# Patient Record
Sex: Male | Born: 1960 | Race: White | Hispanic: No | Marital: Single | Smoking: Never smoker
Health system: Southern US, Community
[De-identification: ages and names within clinical notes are randomized; demographics above are authoritative.]

## PROBLEM LIST (undated history)

## (undated) DIAGNOSIS — B182 Chronic viral hepatitis C: Secondary | ICD-10-CM

## (undated) DIAGNOSIS — I10 Essential (primary) hypertension: Secondary | ICD-10-CM

## (undated) DIAGNOSIS — K7460 Unspecified cirrhosis of liver: Secondary | ICD-10-CM

## (undated) HISTORY — PX: ESOPHAGUS SURGERY: SHX626

---

## 2015-10-18 ENCOUNTER — Emergency Department (HOSPITAL_COMMUNITY)
Admission: EM | Admit: 2015-10-18 | Discharge: 2015-10-18 | Disposition: A | Discharge status: Home / Self Care | Payer: Medicaid Other | Attending: Emergency Medicine | Admitting: Emergency Medicine

## 2015-10-18 ENCOUNTER — Emergency Department (HOSPITAL_COMMUNITY): Payer: Medicaid Other

## 2015-10-18 ENCOUNTER — Encounter (HOSPITAL_COMMUNITY): Payer: Self-pay | Admitting: Emergency Medicine

## 2015-10-18 DIAGNOSIS — S3991XA Unspecified injury of abdomen, initial encounter: Secondary | ICD-10-CM | POA: Insufficient documentation

## 2015-10-18 DIAGNOSIS — S8992XA Unspecified injury of left lower leg, initial encounter: Secondary | ICD-10-CM | POA: Insufficient documentation

## 2015-10-18 DIAGNOSIS — Z8619 Personal history of other infectious and parasitic diseases: Secondary | ICD-10-CM | POA: Diagnosis not present

## 2015-10-18 DIAGNOSIS — S4991XA Unspecified injury of right shoulder and upper arm, initial encounter: Secondary | ICD-10-CM | POA: Insufficient documentation

## 2015-10-18 DIAGNOSIS — S299XXA Unspecified injury of thorax, initial encounter: Secondary | ICD-10-CM | POA: Diagnosis not present

## 2015-10-18 DIAGNOSIS — Y92410 Unspecified street and highway as the place of occurrence of the external cause: Secondary | ICD-10-CM | POA: Diagnosis not present

## 2015-10-18 DIAGNOSIS — R52 Pain, unspecified: Secondary | ICD-10-CM

## 2015-10-18 DIAGNOSIS — I10 Essential (primary) hypertension: Secondary | ICD-10-CM | POA: Diagnosis not present

## 2015-10-18 DIAGNOSIS — Z880 Allergy status to penicillin: Secondary | ICD-10-CM | POA: Insufficient documentation

## 2015-10-18 DIAGNOSIS — Y998 Other external cause status: Secondary | ICD-10-CM | POA: Insufficient documentation

## 2015-10-18 DIAGNOSIS — S199XXA Unspecified injury of neck, initial encounter: Secondary | ICD-10-CM | POA: Insufficient documentation

## 2015-10-18 DIAGNOSIS — S3992XA Unspecified injury of lower back, initial encounter: Secondary | ICD-10-CM | POA: Insufficient documentation

## 2015-10-18 DIAGNOSIS — S8991XA Unspecified injury of right lower leg, initial encounter: Secondary | ICD-10-CM | POA: Diagnosis not present

## 2015-10-18 DIAGNOSIS — Y9389 Activity, other specified: Secondary | ICD-10-CM | POA: Diagnosis not present

## 2015-10-18 DIAGNOSIS — Z8719 Personal history of other diseases of the digestive system: Secondary | ICD-10-CM | POA: Diagnosis not present

## 2015-10-18 HISTORY — DX: Essential (primary) hypertension: I10

## 2015-10-18 HISTORY — DX: Chronic viral hepatitis C: B18.2

## 2015-10-18 HISTORY — DX: Unspecified cirrhosis of liver: K74.60

## 2015-10-18 MED ORDER — IBUPROFEN 800 MG PO TABS
800.0000 mg | ORAL_TABLET | Freq: Once | ORAL | Status: AC
  Administered 2015-10-18: 800 mg via ORAL
  Filled 2015-10-18: qty 1

## 2015-10-18 MED ORDER — IBUPROFEN 800 MG PO TABS
800.0000 mg | ORAL_TABLET | Freq: Three times a day (TID) | ORAL | Status: DC
Start: 2015-10-18 — End: 2017-06-19
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
## 2015-10-18 NOTE — Discharge Instructions (Signed)

## 2017-01-08 ENCOUNTER — Encounter (HOSPITAL_COMMUNITY): Payer: Self-pay | Admitting: Emergency Medicine

## 2017-01-08 ENCOUNTER — Emergency Department (HOSPITAL_COMMUNITY)
Admission: EM | Admit: 2017-01-08 | Discharge: 2017-01-09 | Disposition: A | Discharge status: Home / Self Care | Payer: Medicaid Other | Attending: Emergency Medicine | Admitting: Emergency Medicine

## 2017-01-08 DIAGNOSIS — Z79899 Other long term (current) drug therapy: Secondary | ICD-10-CM | POA: Diagnosis not present

## 2017-01-08 DIAGNOSIS — K7460 Unspecified cirrhosis of liver: Secondary | ICD-10-CM | POA: Insufficient documentation

## 2017-01-08 DIAGNOSIS — I10 Essential (primary) hypertension: Secondary | ICD-10-CM | POA: Insufficient documentation

## 2017-01-08 DIAGNOSIS — R410 Disorientation, unspecified: Secondary | ICD-10-CM | POA: Diagnosis present

## 2017-01-08 LAB — COMPREHENSIVE METABOLIC PANEL
ALBUMIN: 3.5 g/dL (ref 3.5–5.0)
ALK PHOS: 48 U/L (ref 38–126)
ALT: 19 U/L (ref 17–63)
AST: 31 U/L (ref 15–41)
Anion gap: 8 (ref 5–15)
BUN: 11 mg/dL (ref 6–20)
CALCIUM: 9.1 mg/dL (ref 8.9–10.3)
CO2: 23 mmol/L (ref 22–32)
CREATININE: 0.62 mg/dL (ref 0.61–1.24)
Chloride: 107 mmol/L (ref 101–111)
GFR calc Af Amer: >60 mL/min (ref >60)
GFR calc non Af Amer: >60 mL/min (ref >60)
GLUCOSE: 100 mg/dL — AB (ref 65–99)
Potassium: 3.6 mmol/L (ref 3.5–5.1)
SODIUM: 138 mmol/L (ref 135–145)
Total Bilirubin: 1.5 mg/dL — ABNORMAL HIGH (ref 0.3–1.2)
Total Protein: 7.9 g/dL (ref 6.5–8.1)

## 2017-01-08 LAB — CBC
HCT: 35.1 % — ABNORMAL LOW (ref 39.0–52.0)
Hemoglobin: 11.8 g/dL — ABNORMAL LOW (ref 13.0–17.0)
MCH: 29.4 pg (ref 26.0–34.0)
MCHC: 33.6 g/dL (ref 30.0–36.0)
MCV: 87.5 fL (ref 78.0–100.0)
PLATELETS: DECREASED 10*3/uL (ref 150–400)
RBC: 4.01 MIL/uL — ABNORMAL LOW (ref 4.22–5.81)
RDW: 14.9 % (ref 11.5–15.5)
WBC: 2.9 10*3/uL — ABNORMAL LOW (ref 4.0–10.5)

## 2017-01-08 LAB — CBG MONITORING, ED: GLUCOSE-CAPILLARY: 105 mg/dL — AB (ref 65–99)

## 2017-01-08 LAB — ETHANOL: Alcohol, Ethyl (B): <5 mg/dL (ref <5)

## 2017-01-08 LAB — AMMONIA: Ammonia: 80 umol/L — ABNORMAL HIGH (ref 9–35)

## 2017-01-08 LAB — SALICYLATE LEVEL

## 2017-01-08 LAB — ACETAMINOPHEN LEVEL: Acetaminophen (Tylenol), Serum: <10 ug/mL — ABNORMAL LOW (ref 10–30)

## 2017-01-08 NOTE — ED Triage Notes (Addendum)
Patient states "I was in the hospital last week for my ammonia level being up and I think it's up again. Since this morning, I've felt confused and disoriented. I've been dizzy and falling down too." Denies head injury or LOC. States "I'm staggering like I'm drunk but I haven't drank anything."

## 2017-01-08 NOTE — ED Provider Notes (Signed)
[facility]-EMERGENCY DEPT Provider Note   CSN: [id number] Arrival date & time: 01/08/17  2150  By signing my name below, I, Andre Sanchez, attest that this documentation has been prepared under the direction and in the presence of Nira Conn, MD. Electronically Signed: Rosario Sanchez, ED Scribe. 01/08/17. 10:29 PM.  History   Chief Complaint Chief Complaint  Patient presents with  . Altered Mental Status   The history is provided by the patient. No language interpreter was used.    HPI Comments: Andre Sanchez is a 56 y.o. male with a h/o chronic hepatitis C, HTN, and liver cirrhosis, who presents to the Emergency Department complaining of gradually worsening confusion and disorientation beginning this morning. Last known normal: 12 hours ago. Pt also reports that he has had increased issues with balance and coordination issues, described as him "feeling like I'm drunk but I haven't been drinking". Pt reports that ten days ago that he was admitted for one week in the hospital for an increased ammonia level. He was d/c'd three days ago and has been staying with a friend. Per friend at bedside, pt was normal when he woke up this morning but as he began working this morning around 10am, he noticed that the pt was increasing confused and acting away from his baseline. No recent falls, head injury or episode of syncope. He denies fever, cough, nausea, vomiting, diarrhea, or any other associated symptoms.   Past Medical History:  Diagnosis Date  . Hepatitis C, chronic (HCC)   . Hypertension   . Liver cirrhosis (HCC)    There are no active problems to display for this patient.  Past Surgical History:  Procedure Laterality Date  . ESOPHAGUS SURGERY      Home Medications    Prior to Admission medications   Medication Sig Start Date End Date Taking? Authorizing Provider  CONSTULOSE 10 GM/15ML solution TAKE 67.5ML THREE TIMES DAILY 01/03/17   Historical Provider, MD    furosemide (LASIX) 40 MG tablet Take 40 mg by mouth 2 (two) times daily. 01/03/17   Historical Provider, MD  ibuprofen (ADVIL,MOTRIN) 800 MG tablet Take 1 tablet (800 mg total) by mouth 3 (three) times daily. 10/18/15   Cheri Fowler, PA-C  oxyCODONE (OXY IR/ROXICODONE) 5 MG immediate release tablet Take 5 mg by mouth 4 (four) times daily as needed. for pain 01/03/17   Historical Provider, MD  pantoprazole (PROTONIX) 40 MG tablet Take 40 mg by mouth daily. 01/03/17   Historical Provider, MD  propranolol (INDERAL) 10 MG tablet TAKE 1 TABLET BY MOUTH EVERY TWELVE HOURS 11/01/16   Historical Provider, MD  SUBOXONE 8-2 MG FILM DISSOLVE FILMS UNDER THE TONGUE TWICE DAILY 12/29/16   Historical Provider, MD   Family History Family History  Problem Relation Age of Onset  . Hypertension Mother    Social History Social History  Substance Use Topics  . Smoking status: Never Smoker  . Smokeless tobacco: Never Used  . Alcohol use No   Allergies   Penicillins  Review of Systems Review of Systems  A complete 10 system review of systems was obtained and all systems are negative except as noted in the HPI and PMH.   Physical Exam Updated Vital Signs BP 161/81 (BP Location: Left Arm)   Pulse 105   Temp 97.4 F (36.3 C) (Oral)   Resp 22   Ht 6\' 7"  (2.007 m)   Wt 300 lb (136.1 kg)   SpO2 100%   BMI 33.80 kg/m  Physical Exam  Constitutional: He is oriented to person, place, and time. He appears well-developed and well-nourished. No distress.  HENT:  Head: Normocephalic and atraumatic.  Nose: Nose normal.  Eyes: Conjunctivae and EOM are normal. Pupils are equal, round, and reactive to light. Right eye exhibits no discharge. Left eye exhibits no discharge. No scleral icterus.  Neck: Normal range of motion. Neck supple.  Cardiovascular: Normal rate and regular rhythm.  Exam reveals no gallop and no friction rub.   No murmur heard. Pulmonary/Chest: Effort normal and breath sounds normal. No stridor. No  respiratory distress. He has no rales.  Abdominal: Soft. He exhibits no distension. There is no tenderness.  Musculoskeletal: He exhibits no edema or tenderness.  Neurological: He is alert and oriented to person, place, and time. He has normal strength. No cranial nerve deficit.  Pt able to ambulate without complication in the ED  Skin: Skin is warm and dry. No rash noted. He is not diaphoretic. No erythema.  Psychiatric: He has a normal mood and affect.  Vitals reviewed.  ED Treatments / Results  DIAGNOSTIC STUDIES: Oxygen Saturation is 100% on RA, normal by my interpretation.   COORDINATION OF CARE: 10:26 PM-Discussed next steps with pt. Pt verbalized understanding and is agreeable with the plan.   Labs (all labs ordered are listed, but only abnormal results are displayed) Labs Reviewed  COMPREHENSIVE METABOLIC PANEL - Abnormal; Notable for the following:       Result Value   Glucose, Bld 100 (*)    Total Bilirubin 1.5 (*)    All other components within normal limits  CBC - Abnormal; Notable for the following:    WBC 2.9 (*)    RBC 4.01 (*)    Hemoglobin 11.8 (*)    HCT 35.1 (*)    All other components within normal limits  AMMONIA - Abnormal; Notable for the following:    Ammonia 80 (*)    All other components within normal limits  RAPID URINE DRUG SCREEN, HOSP PERFORMED - Abnormal; Notable for the following:    Cocaine POSITIVE (*)    All other components within normal limits  ACETAMINOPHEN LEVEL - Abnormal; Notable for the following:    Acetaminophen (Tylenol), Serum <10 (*)    All other components within normal limits  CBG MONITORING, ED - Abnormal; Notable for the following:    Glucose-Capillary 105 (*)    All other components within normal limits  ETHANOL  SALICYLATE LEVEL   EKG  EKG Interpretation None      Radiology No results found.  Procedures Procedures  Medications Ordered in ED Medications - No data to display  Initial Impression /  Assessment and Plan / ED Course  I have reviewed the triage vital signs and the nursing notes.  Pertinent labs & imaging results that were available during my care of the patient were reviewed by me and considered in my medical decision making (see chart for details).     Elevated ammonia but pt is oriented x4. Able to ambulate without complication in the ED. Other labs reassuring. The patient is safe for discharge with strict return precautions.   Final Clinical Impressions(s) / ED Diagnoses   Final diagnoses:  Cirrhosis of liver without ascites, unspecified hepatic cirrhosis type (HCC)   Disposition: Discharge  Condition: Good  I have discussed the results, Dx and Tx plan with the patient who expressed understanding and agree(s) with the plan. Discharge instructions discussed at great length. The patient was given strict  return precautions who verbalized understanding of the instructions. No further questions at time of discharge.    New Prescriptions   No medications on file    Follow Up: primary care provider  Schedule an appointment as soon as possible for a visit  As needed   I personally performed the services described in this documentation, which was scribed in my presence. The recorded information has been reviewed and is accurate.        Nira Conn, MD 01/09/17 [phone]

## 2017-01-09 LAB — RAPID URINE DRUG SCREEN, HOSP PERFORMED
AMPHETAMINES: NOT DETECTED
BARBITURATES: NOT DETECTED
Benzodiazepines: NOT DETECTED
Cocaine: POSITIVE — AB
Opiates: NOT DETECTED
TETRAHYDROCANNABINOL: NOT DETECTED

## 2017-01-09 NOTE — ED Notes (Signed)
Pt stable on his feet pt walked about a total of 20 feet without problems. Dr went in to talk to the patient about him he does not need to be admitted in his option pt became mad about this and he got dressed and called his ride

## 2017-01-09 NOTE — ED Notes (Signed)
Attempted to ambulate patient but he refused to ambulate.

## 2017-06-19 ENCOUNTER — Encounter (HOSPITAL_COMMUNITY): Payer: Self-pay | Admitting: Emergency Medicine

## 2017-06-19 ENCOUNTER — Emergency Department (HOSPITAL_COMMUNITY)
Admission: EM | Admit: 2017-06-19 | Discharge: 2017-06-19 | Disposition: A | Discharge status: Home / Self Care | Payer: Medicaid Other | Attending: Emergency Medicine | Admitting: Emergency Medicine

## 2017-06-19 ENCOUNTER — Emergency Department (HOSPITAL_COMMUNITY): Payer: Medicaid Other

## 2017-06-19 DIAGNOSIS — L03115 Cellulitis of right lower limb: Secondary | ICD-10-CM

## 2017-06-19 DIAGNOSIS — Z79899 Other long term (current) drug therapy: Secondary | ICD-10-CM | POA: Insufficient documentation

## 2017-06-19 DIAGNOSIS — R2241 Localized swelling, mass and lump, right lower limb: Secondary | ICD-10-CM | POA: Diagnosis present

## 2017-06-19 DIAGNOSIS — I10 Essential (primary) hypertension: Secondary | ICD-10-CM | POA: Insufficient documentation

## 2017-06-19 LAB — COMPREHENSIVE METABOLIC PANEL
ALT: 12 U/L — ABNORMAL LOW (ref 17–63)
AST: 27 U/L (ref 15–41)
Albumin: 3.2 g/dL — ABNORMAL LOW (ref 3.5–5.0)
Alkaline Phosphatase: 42 U/L (ref 38–126)
Anion gap: 6 (ref 5–15)
BUN: 9 mg/dL (ref 6–20)
CHLORIDE: 108 mmol/L (ref 101–111)
CO2: 21 mmol/L — ABNORMAL LOW (ref 22–32)
CREATININE: 0.62 mg/dL (ref 0.61–1.24)
Calcium: 8.7 mg/dL — ABNORMAL LOW (ref 8.9–10.3)
Glucose, Bld: 111 mg/dL — ABNORMAL HIGH (ref 65–99)
POTASSIUM: 3.7 mmol/L (ref 3.5–5.1)
Sodium: 135 mmol/L (ref 135–145)
Total Bilirubin: 1.9 mg/dL — ABNORMAL HIGH (ref 0.3–1.2)
Total Protein: 7.7 g/dL (ref 6.5–8.1)

## 2017-06-19 LAB — CBC WITH DIFFERENTIAL/PLATELET
BASOS ABS: 0 10*3/uL (ref 0.0–0.1)
BASOS PCT: 0 %
EOS ABS: 0.2 10*3/uL (ref 0.0–0.7)
Eosinophils Relative: 7 %
HEMATOCRIT: 32 % — AB (ref 39.0–52.0)
HEMOGLOBIN: 11 g/dL — AB (ref 13.0–17.0)
Lymphocytes Relative: 21 %
Lymphs Abs: 0.6 10*3/uL — ABNORMAL LOW (ref 0.7–4.0)
MCH: 30.4 pg (ref 26.0–34.0)
MCHC: 34.4 g/dL (ref 30.0–36.0)
MCV: 88.4 fL (ref 78.0–100.0)
MONOS PCT: 13 %
Monocytes Absolute: 0.4 10*3/uL (ref 0.1–1.0)
NEUTROS ABS: 1.8 10*3/uL (ref 1.7–7.7)
NEUTROS PCT: 59 %
Platelets: 100 10*3/uL — ABNORMAL LOW (ref 150–400)
RBC: 3.62 MIL/uL — ABNORMAL LOW (ref 4.22–5.81)
RDW: 15.2 % (ref 11.5–15.5)
WBC: 3 10*3/uL — AB (ref 4.0–10.5)

## 2017-06-19 MED ORDER — SULFAMETHOXAZOLE-TRIMETHOPRIM 800-160 MG PO TABS: 1.0000 | ORAL_TABLET | Freq: Two times a day (BID) | ORAL | 0 refills | Status: AC

## 2017-06-19 MED ORDER — VANCOMYCIN HCL IN DEXTROSE 1-5 GM/200ML-% IV SOLN
1000.0000 mg | Freq: Once | INTRAVENOUS | Status: AC
  Administered 2017-06-19: 1000 mg via INTRAVENOUS
  Filled 2017-06-19: qty 200

## 2017-06-19 NOTE — ED Triage Notes (Signed)
Pt reports right leg swelling from the knee down with redness x 2 days.

## 2017-06-19 NOTE — ED Provider Notes (Signed)
[facility]-EMERGENCY DEPT Provider Note   CSN: [id number] Arrival date & time: 06/19/17  1316     History   Chief Complaint Chief Complaint  Patient presents with  . Leg Swelling    HPI Andre Sanchez is a 56 y.o. male.  Patient complains of pain and swelling to his right lower leg.    Leg Pain   This is a new problem. The current episode started more than 2 days ago. The problem occurs constantly. The problem has not changed since onset.Pain location: right lower leg. The quality of the pain is described as aching. The pain is at a severity of 5/10. The pain is moderate. Pertinent negatives include no numbness. He has tried nothing for the symptoms. The treatment provided no relief.    Past Medical History:  Diagnosis Date  . Hepatitis C, chronic (HCC)   . Hypertension   . Liver cirrhosis (HCC)     There are no active problems to display for this patient.   Past Surgical History:  Procedure Laterality Date  . ESOPHAGUS SURGERY         Home Medications    Prior to Admission medications   Medication Sig Start Date End Date Taking? Authorizing Provider  spironolactone (ALDACTONE) 50 MG tablet Take 50 mg by mouth daily.    Yes [provider]  CONSTULOSE 10 GM/15ML solution TAKE 67.5ML THREE TIMES DAILY 01/03/17   [provider]  furosemide (LASIX) 40 MG tablet Take 40 mg by mouth 2 (two) times daily. 01/03/17   [provider]  oxyCODONE (OXY IR/ROXICODONE) 5 MG immediate release tablet Take 5 mg by mouth 4 (four) times daily as needed. for pain 01/03/17   [provider]  pantoprazole (PROTONIX) 40 MG tablet Take 40 mg by mouth daily. 01/03/17   [provider]  propranolol (INDERAL) 10 MG tablet TAKE 1 TABLET BY MOUTH EVERY TWELVE HOURS 11/01/16   [provider]  SUBOXONE 8-2 MG FILM DISSOLVE FILMS UNDER THE TONGUE TWICE DAILY 12/29/16   [provider]  sulfamethoxazole-trimethoprim (BACTRIM DS,SEPTRA DS) 800-160 MG  tablet Take 1 tablet by mouth 2 (two) times daily. 06/19/17 06/26/17  Bethann BerkshireZammit, Braylyn Eye, MD    Family History Family History  Problem Relation Age of Onset  . Hypertension Mother     Social History Social History  Substance Use Topics  . Smoking status: Never Smoker  . Smokeless tobacco: Never Used  . Alcohol use No     Allergies   Penicillins   Review of Systems Review of Systems  Constitutional: Negative for appetite change and fatigue.  HENT: Negative for congestion, ear discharge and sinus pressure.   Eyes: Negative for discharge.  Respiratory: Negative for cough.   Cardiovascular: Negative for chest pain.  Gastrointestinal: Negative for abdominal pain and diarrhea.  Genitourinary: Negative for frequency and hematuria.  Musculoskeletal: Negative for back pain.       Right lower leg mildly swollen red tender  Skin: Positive for rash.  Neurological: Negative for seizures, numbness and headaches.  Psychiatric/Behavioral: Negative for hallucinations.     Physical Exam Updated Vital Signs BP 120/78   Pulse 100   Temp 97.9 F (36.6 C) (Oral)   Resp 18   Ht 6\' 4"  (1.93 m)   Wt (!) 145.2 kg (320 lb)   SpO2 98%   BMI 38.95 kg/m   Physical Exam  Constitutional: He is oriented to person, place, and time. He appears well-developed.  HENT:  Head: Normocephalic.  Eyes:  Conjunctivae and EOM are normal. No scleral icterus.  Neck: Neck supple. No thyromegaly present.  Cardiovascular: Normal rate and regular rhythm.  Exam reveals no gallop and no friction rub.   No murmur heard. Pulmonary/Chest: No stridor. He has no wheezes. He has no rales. He exhibits no tenderness.  Abdominal: He exhibits no distension. There is no tenderness. There is no rebound.  Musculoskeletal: Normal range of motion. He exhibits no edema.  Swelling and tenderness to right lowe leg.  Also redness  Lymphadenopathy:    He has no cervical adenopathy.  Neurological: He is oriented to person, place,  and time. He exhibits normal muscle tone. Coordination normal.  Skin: No rash noted. No erythema.  Psychiatric: He has a normal mood and affect. His behavior is normal.     ED Treatments / Results  Labs (all labs ordered are listed, but only abnormal results are displayed) Labs Reviewed  CBC WITH DIFFERENTIAL/PLATELET - Abnormal; Notable for the following:       Result Value   WBC 3.0 (*)    RBC 3.62 (*)    Hemoglobin 11.0 (*)    HCT 32.0 (*)    Platelets 100 (*)    Lymphs Abs 0.6 (*)    All other components within normal limits  COMPREHENSIVE METABOLIC PANEL - Abnormal; Notable for the following:    CO2 21 (*)    Glucose, Bld 111 (*)    Calcium 8.7 (*)    Albumin 3.2 (*)    ALT 12 (*)    Total Bilirubin 1.9 (*)    All other components within normal limits    EKG  EKG Interpretation None       Radiology Dg Tibia/fibula Right  Result Date: 06/19/2017 CLINICAL DATA:  Acute right lower leg pain, swelling and redness for 2 days. No known injury. Initial encounter. EXAM: RIGHT TIBIA AND FIBULA - 2 VIEW COMPARISON:  None. FINDINGS: There is no evidence of acute fracture, subluxation, dislocation or focal bony abnormality. Soft tissue swelling is noted. There is no evidence of radiopaque foreign body. IMPRESSION: Soft tissue swelling without acute bony abnormality. Electronically Signed   By: Harmon PierJeffrey  Hu M.D.   On: 06/19/2017 14:50    Procedures Procedures (including critical care time)  Medications Ordered in ED Medications  vancomycin (VANCOCIN) IVPB 1000 mg/200 mL premix (1,000 mg Intravenous New Bag/Given 06/19/17 1451)     Initial Impression / Assessment and Plan / ED Course  I have reviewed the triage vital signs and the nursing notes.  Pertinent labs & imaging results that were available during my care of the patient were reviewed by me and considered in my medical decision making (see chart for details). Pt has a cellulitis to the right lower leg.  rx bactrim  and follow up in 2 days      Final Clinical Impressions(s) / ED Diagnoses   Final diagnoses:  Cellulitis of right leg    New Prescriptions New Prescriptions   SULFAMETHOXAZOLE-TRIMETHOPRIM (BACTRIM DS,SEPTRA DS) 800-160 MG TABLET    Take 1 tablet by mouth 2 (two) times daily.     Bethann BerkshireZammit, Angelyse Heslin, MD 06/19/17 [phone]

## 2017-06-19 NOTE — Discharge Instructions (Signed)
Follow-up with Dr. Sudie BaileyKnowlton or return here in 2 days for recheck

## 2018-01-03 IMAGING — DX DG TIBIA/FIBULA 2V*R*
4 series · 4 of 4 positions shown · non-contrast
Comparison: None.

CLINICAL DATA: Acute right lower leg pain, swelling and redness for
2 days. No known injury. Initial encounter.

EXAM:
RIGHT TIBIA AND FIBULA - 2 VIEW

[tibia ap (1 of 2)]
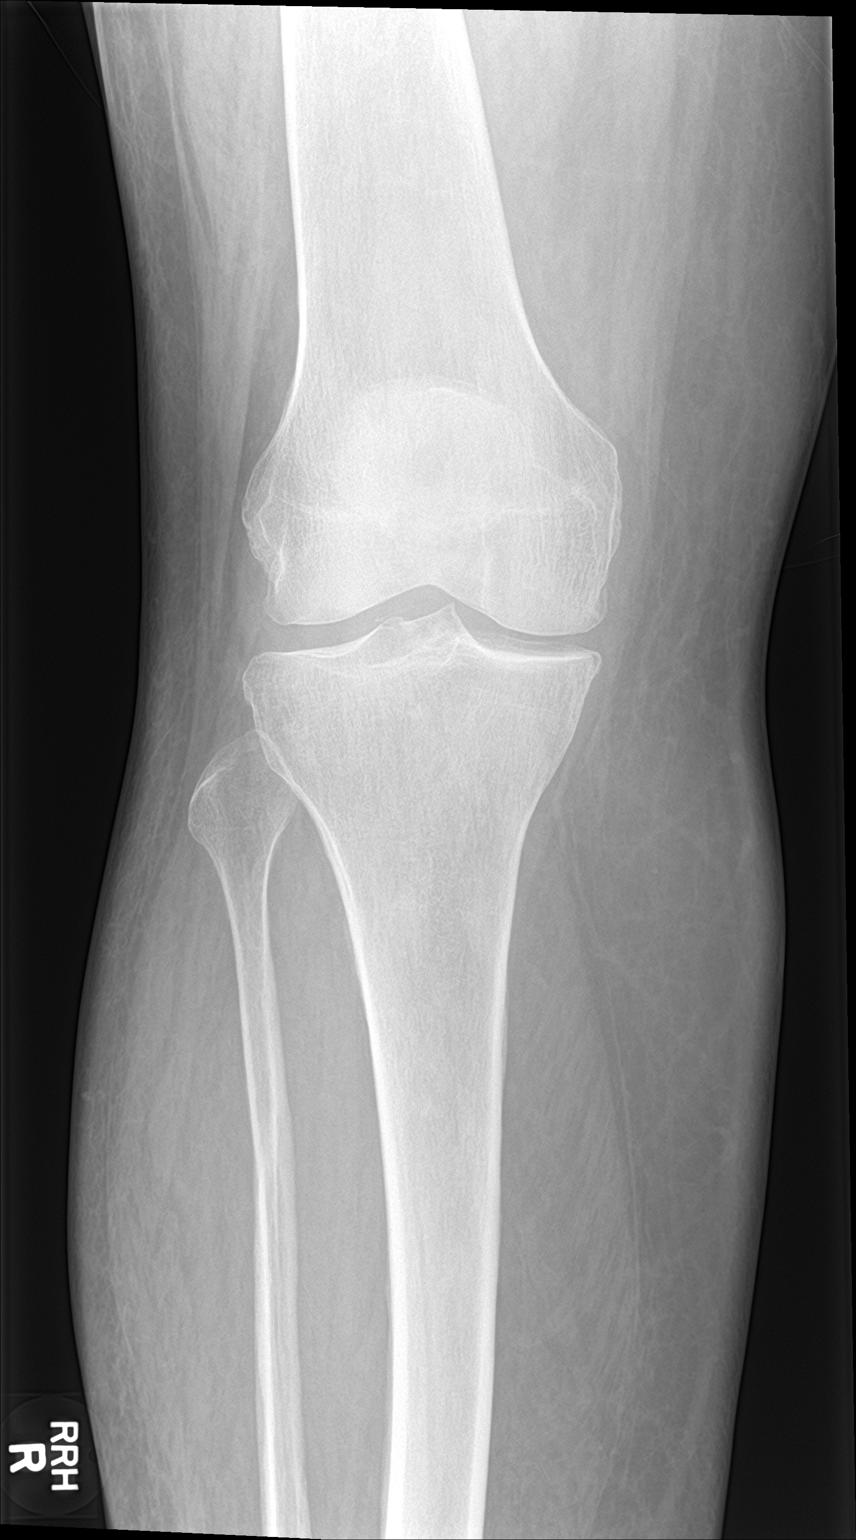

[tibia ap (2 of 2)]
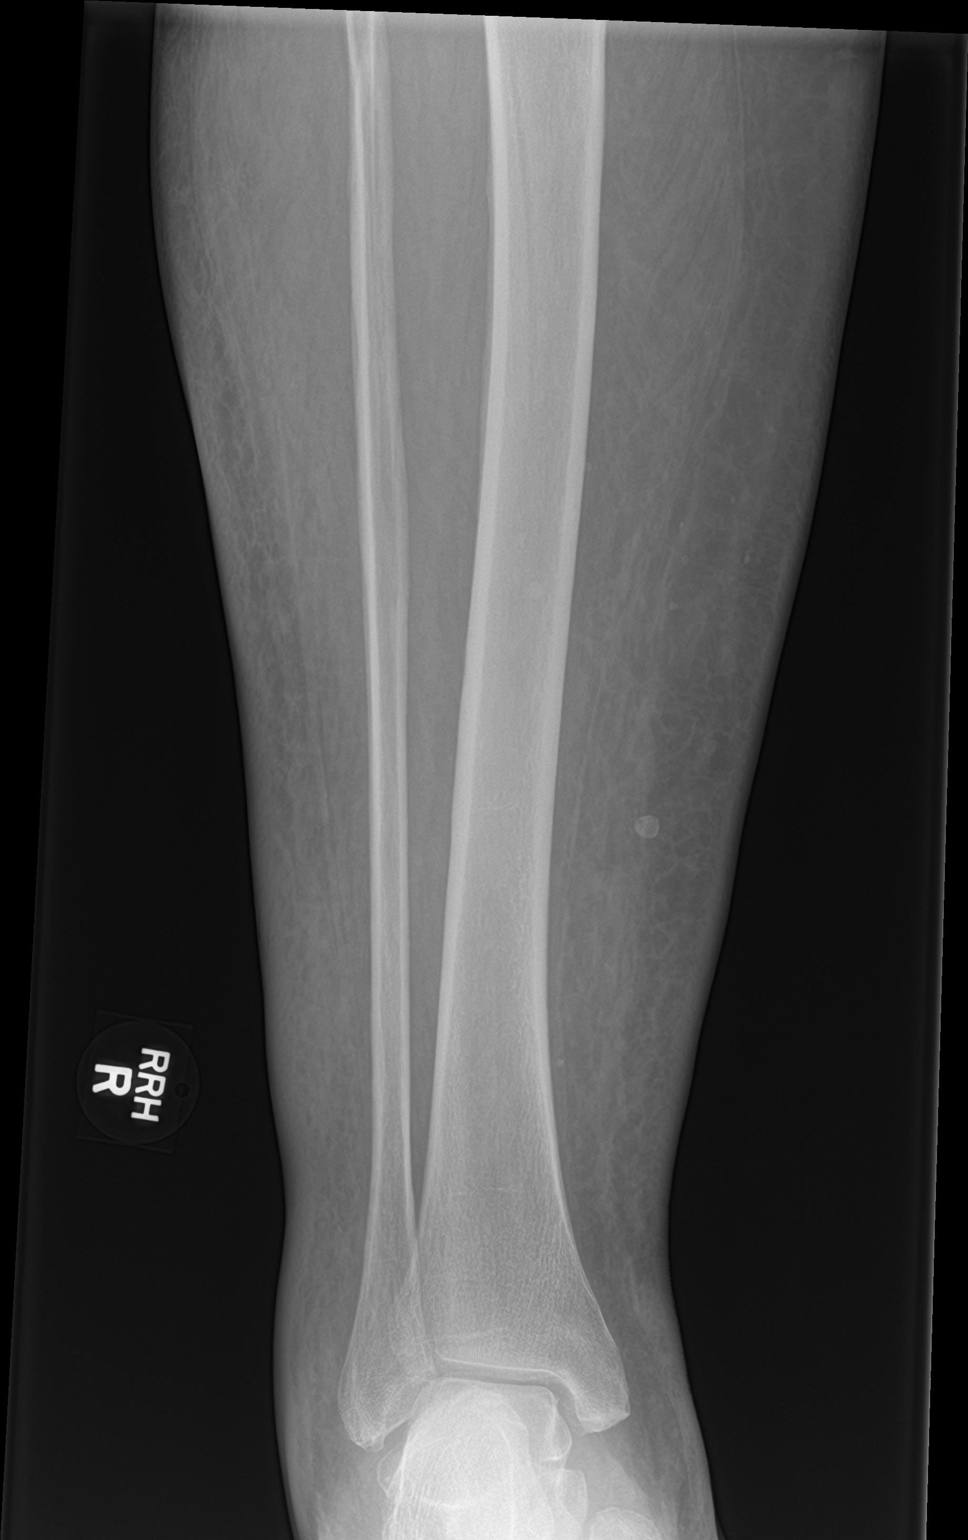

[tibia lat (1 of 2)]
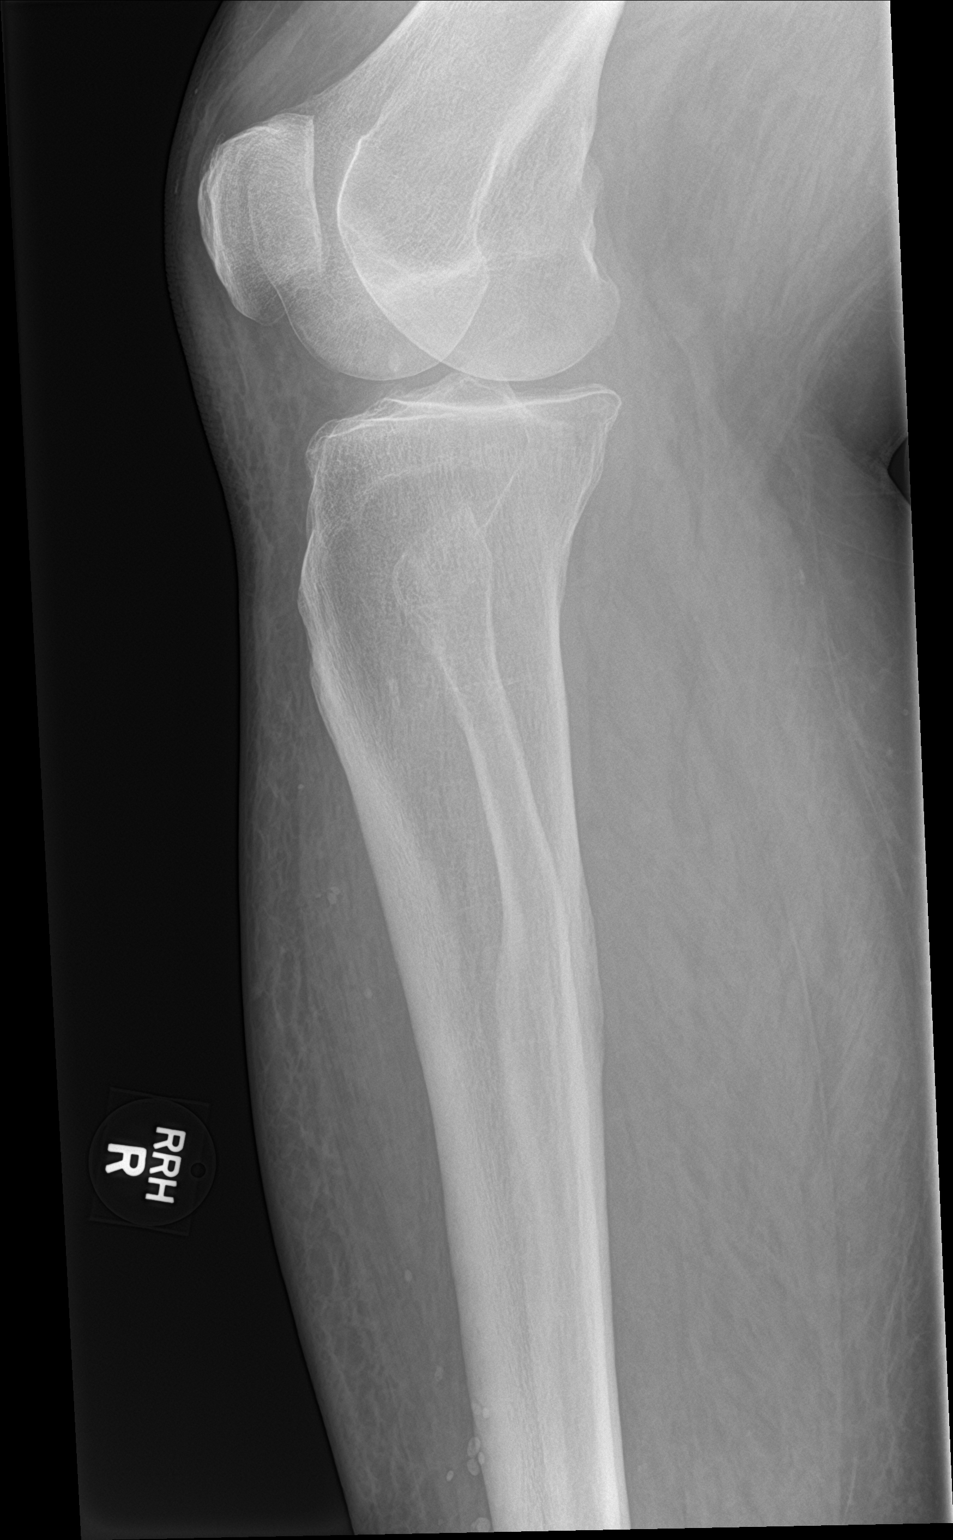

[tibia lat (2 of 2)]
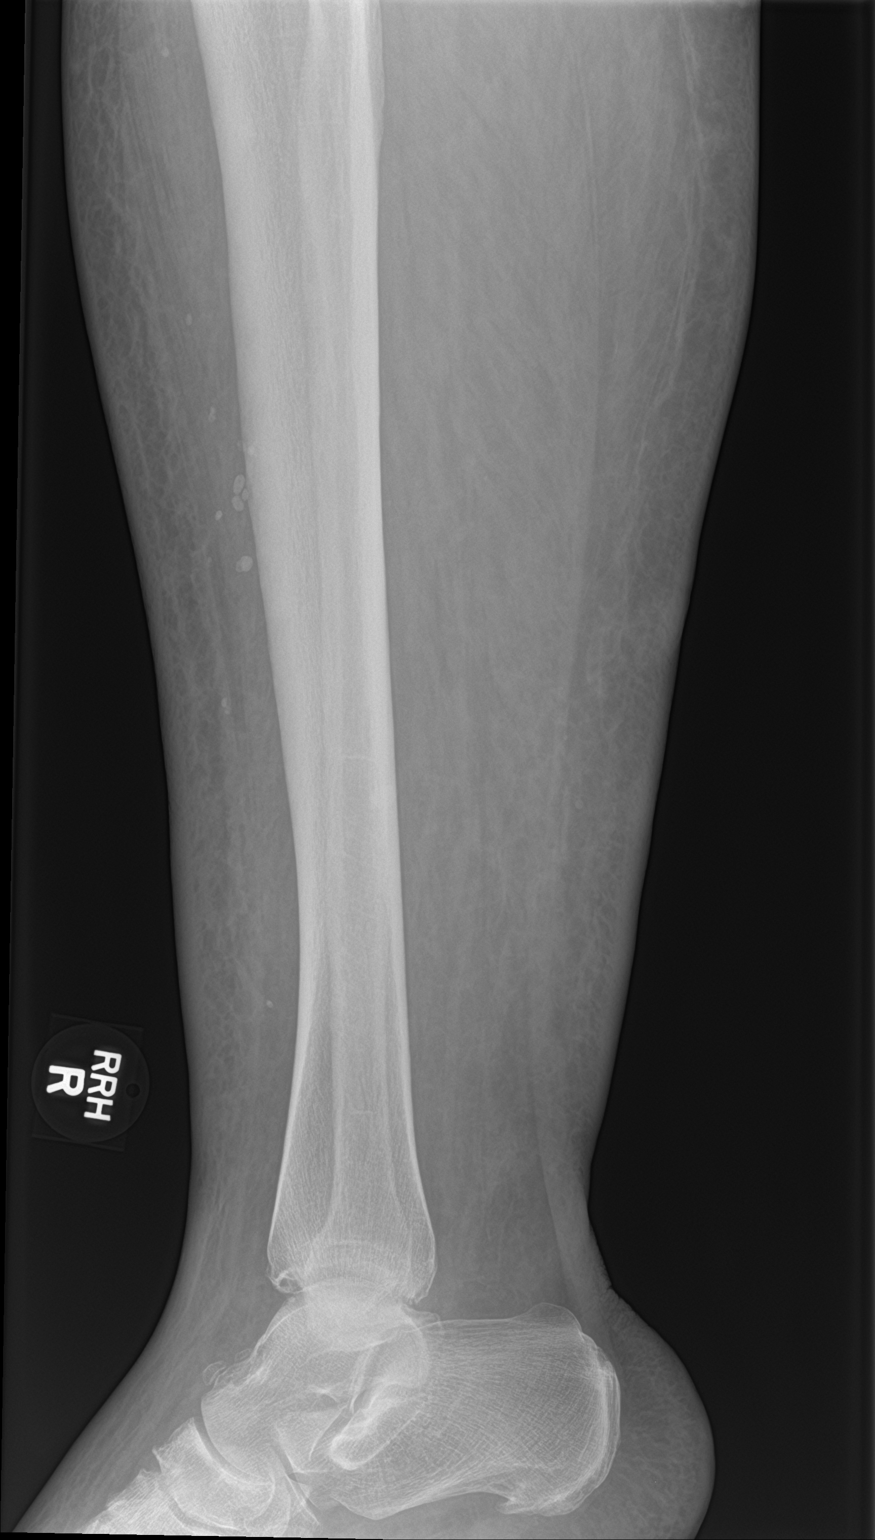

[4 of 4 positions shown; findings below may reference images not displayed]

FINDINGS: There is no evidence of acute fracture, subluxation, dislocation or
focal bony abnormality.

Soft tissue swelling is noted.

There is no evidence of radiopaque foreign body.
IMPRESSION: Soft tissue swelling without acute bony abnormality.

## 2018-10-12 ENCOUNTER — Emergency Department (HOSPITAL_COMMUNITY)
Admission: EM | Admit: 2018-10-12 | Discharge: 2018-10-12 | Disposition: A | Discharge status: Home / Self Care | Payer: Medicaid Other | Attending: Emergency Medicine | Admitting: Emergency Medicine

## 2018-10-12 ENCOUNTER — Encounter (HOSPITAL_COMMUNITY): Payer: Self-pay

## 2018-10-12 ENCOUNTER — Other Ambulatory Visit: Payer: Self-pay

## 2018-10-12 DIAGNOSIS — I10 Essential (primary) hypertension: Secondary | ICD-10-CM | POA: Diagnosis not present

## 2018-10-12 DIAGNOSIS — T25012A Burn of unspecified degree of left ankle, initial encounter: Secondary | ICD-10-CM | POA: Diagnosis present

## 2018-10-12 DIAGNOSIS — Y939 Activity, unspecified: Secondary | ICD-10-CM | POA: Insufficient documentation

## 2018-10-12 DIAGNOSIS — T25212A Burn of second degree of left ankle, initial encounter: Secondary | ICD-10-CM | POA: Insufficient documentation

## 2018-10-12 DIAGNOSIS — Y998 Other external cause status: Secondary | ICD-10-CM | POA: Insufficient documentation

## 2018-10-12 DIAGNOSIS — Z79899 Other long term (current) drug therapy: Secondary | ICD-10-CM | POA: Insufficient documentation

## 2018-10-12 DIAGNOSIS — Y929 Unspecified place or not applicable: Secondary | ICD-10-CM | POA: Insufficient documentation

## 2018-10-12 DIAGNOSIS — X118XXA Contact with other hot tap-water, initial encounter: Secondary | ICD-10-CM | POA: Diagnosis not present

## 2018-10-12 DIAGNOSIS — T25219A Burn of second degree of unspecified ankle, initial encounter: Secondary | ICD-10-CM

## 2018-10-12 LAB — BASIC METABOLIC PANEL
Anion gap: <3 — ABNORMAL LOW (ref 5–15)
BUN: 10 mg/dL (ref 6–20)
CO2: 23 mmol/L (ref 22–32)
CREATININE: 0.6 mg/dL — AB (ref 0.61–1.24)
Calcium: 8.9 mg/dL (ref 8.9–10.3)
Chloride: 113 mmol/L — ABNORMAL HIGH (ref 98–111)
GFR calc Af Amer: >60 mL/min (ref >60)
GFR calc non Af Amer: >60 mL/min (ref >60)
GLUCOSE: 100 mg/dL — AB (ref 70–99)
Potassium: 3.5 mmol/L (ref 3.5–5.1)
SODIUM: 138 mmol/L (ref 135–145)

## 2018-10-12 LAB — CBC WITH DIFFERENTIAL/PLATELET
ABS IMMATURE GRANULOCYTES: 0.02 10*3/uL (ref 0.00–0.07)
BASOS ABS: 0 10*3/uL (ref 0.0–0.1)
Basophils Relative: 1 %
Eosinophils Absolute: 0.2 10*3/uL (ref 0.0–0.5)
Eosinophils Relative: 6 %
HEMATOCRIT: 34.6 % — AB (ref 39.0–52.0)
Hemoglobin: 11 g/dL — ABNORMAL LOW (ref 13.0–17.0)
Immature Granulocytes: 1 %
LYMPHS ABS: 0.5 10*3/uL — AB (ref 0.7–4.0)
LYMPHS PCT: 19 %
MCH: 29.3 pg (ref 26.0–34.0)
MCHC: 31.8 g/dL (ref 30.0–36.0)
MCV: 92.3 fL (ref 80.0–100.0)
Monocytes Absolute: 0.4 10*3/uL (ref 0.1–1.0)
Monocytes Relative: 14 %
NEUTROS ABS: 1.7 10*3/uL (ref 1.7–7.7)
Neutrophils Relative %: 59 %
PLATELETS: 97 10*3/uL — AB (ref 150–400)
RBC: 3.75 MIL/uL — ABNORMAL LOW (ref 4.22–5.81)
RDW: 15 % (ref 11.5–15.5)
WBC: 2.9 10*3/uL — AB (ref 4.0–10.5)
nRBC: 0 % (ref 0.0–0.2)

## 2018-10-12 MED ORDER — SILVER SULFADIAZINE 1 % EX CREA: 1.0000 "application " | TOPICAL_CREAM | Freq: Every day | CUTANEOUS | 0 refills | Status: DC

## 2018-10-12 MED ORDER — CLINDAMYCIN HCL 300 MG PO CAPS: 300.0000 mg | ORAL_CAPSULE | Freq: Four times a day (QID) | ORAL | 0 refills | Status: DC

## 2018-10-12 MED ORDER — CLINDAMYCIN HCL 150 MG PO CAPS
300.0000 mg | ORAL_CAPSULE | Freq: Once | ORAL | Status: AC
  Administered 2018-10-12: 300 mg via ORAL
  Filled 2018-10-12: qty 2

## 2018-10-12 MED ORDER — SILVER SULFADIAZINE 1 % EX CREA
TOPICAL_CREAM | Freq: Once | CUTANEOUS | Status: AC
  Administered 2018-10-12: 16:00:00 via TOPICAL
  Filled 2018-10-12: qty 50

## 2018-10-12 NOTE — Discharge Instructions (Addendum)
Dr. Henreitta LeberBridges office on Monday morning to schedule follow-up appointment.  Return immediately for any worsening swelling, redness, fever or concerns.

## 2018-10-12 NOTE — ED Triage Notes (Signed)
Pt reports spilling boiling water on left ankle approx a week ago. Pt reports putting neosporin ointment on burn, but now it is bleeding

## 2018-10-12 NOTE — ED Provider Notes (Signed)
[facility] EMERGENCY DEPARTMENT Provider Note   CSN: [id number] Arrival date & time: 10/12/18  1309     History   Chief Complaint Chief Complaint  Patient presents with  . Burn    HPI Andre Sanchez is a 57 y.o. male.  HPI Patient states that one week ago he spilled boiling water onto his left ankle.  He has been applying Neosporin ointment to the burn.  States that he has had increased swelling and pain to the ankle over the last few days.  Denies any fever or chills.  No history of diabetes. Past Medical History:  Diagnosis Date  . Hepatitis C, chronic (HCC)   . Hypertension   . Liver cirrhosis (HCC)     There are no active problems to display for this patient.   Past Surgical History:  Procedure Laterality Date  . ESOPHAGUS SURGERY          Home Medications    Prior to Admission medications   Medication Sig Start Date End Date Taking? Authorizing Provider  CONSTULOSE 10 GM/15ML solution TAKE 67.5ML THREE TIMES DAILY 01/03/17  Yes [provider]  furosemide (LASIX) 40 MG tablet Take 40 mg by mouth 2 (two) times daily. 01/03/17  Yes [provider]  spironolactone (ALDACTONE) 50 MG tablet Take 50 mg by mouth daily.    Yes [provider]  clindamycin (CLEOCIN) 300 MG capsule Take 1 capsule (300 mg total) by mouth 4 (four) times daily. X 7 days 10/12/18   Loren Racer, MD  silver sulfADIAZINE (SILVADENE) 1 % cream Apply 1 application topically daily. 10/12/18   Loren Racer, MD    Family History Family History  Problem Relation Age of Onset  . Hypertension Mother     Social History Social History   Tobacco Use  . Smoking status: Never Smoker  . Smokeless tobacco: Never Used  Substance Use Topics  . Alcohol use: No  . Drug use: No     Allergies   Patient has no active allergies.   Review of Systems Review of Systems  Constitutional: Negative for chills and fever.  Respiratory: Negative for shortness of breath.     Cardiovascular: Negative for chest pain.  Gastrointestinal: Negative for abdominal pain, nausea and vomiting.  Skin: Positive for wound.  Neurological: Negative for dizziness, weakness, light-headedness, numbness and headaches.  All other systems reviewed and are negative.    Physical Exam Updated Vital Signs BP 129/75 (BP Location: Right Arm)   Pulse 90   Temp 97.7 F (36.5 C) (Oral)   Wt (!) 163.3 kg   SpO2 99%   BMI 43.82 kg/m   Physical Exam  Constitutional: He is oriented to person, place, and time. He appears well-developed and well-nourished. No distress.  HENT:  Head: Normocephalic and atraumatic.  Mouth/Throat: Oropharynx is clear and moist.  Eyes: Pupils are equal, round, and reactive to light. EOM are normal.  Neck: Normal range of motion. Neck supple.  Cardiovascular: Normal rate and regular rhythm.  Pulmonary/Chest: Effort normal and breath sounds normal.  Abdominal: Soft. Bowel sounds are normal. There is no tenderness. There is no rebound and no guarding.  Musculoskeletal: Normal range of motion. He exhibits tenderness. He exhibits no edema.  Patient with partial thickness burn to the lateral surface of the left ankle less than 1% total body surface area.  Surrounding swelling and tenderness to palpation.  Question mild warmth.  No crepitance or deformity.  Distal pulses intact.  Neurological: He is alert and  oriented to person, place, and time.  Skin: Skin is warm and dry. No rash noted. He is not diaphoretic. No erythema.  Psychiatric: He has a normal mood and affect. His behavior is normal.  Nursing note and vitals reviewed.    ED Treatments / Results  Labs (all labs ordered are listed, but only abnormal results are displayed) Labs Reviewed  CBC WITH DIFFERENTIAL/PLATELET - Abnormal; Notable for the following components:      Result Value   WBC 2.9 (*)    RBC 3.75 (*)    Hemoglobin 11.0 (*)    HCT 34.6 (*)    Platelets 97 (*)    Lymphs Abs 0.5 (*)     All other components within normal limits  BASIC METABOLIC PANEL - Abnormal; Notable for the following components:   Chloride 113 (*)    Glucose, Bld 100 (*)    Creatinine, Ser 0.60 (*)    Anion gap <3 (*)    All other components within normal limits    EKG None  Radiology No results found.  Procedures Procedures (including critical care time)  Medications Ordered in ED Medications  clindamycin (CLEOCIN) capsule 300 mg (has no administration in time range)  silver sulfADIAZINE (SILVADENE) 1 % cream (has no administration in time range)     Initial Impression / Assessment and Plan / ED Course  I have reviewed the triage vital signs and the nursing notes.  Pertinent labs & imaging results that were available during my care of the patient were reviewed by me and considered in my medical decision making (see chart for details).     Question early secondary infection to partial-thickness burn of the left ankle.  Will start on oral antibiotics.  Will check baseline labs. First dose of clindamycin in the emergency department and Silvadene ointment and dressing placed.  We will follow-up with Dr. Henreitta LeberBridges in her office.  Strict return precautions given. Final Clinical Impressions(s) / ED Diagnoses   Final diagnoses:  Partial thickness burn of ankle, initial encounter    ED Discharge Orders         Ordered    silver sulfADIAZINE (SILVADENE) 1 % cream  Daily     10/12/18 1605    clindamycin (CLEOCIN) 300 MG capsule  4 times daily     10/12/18 1605           Loren RacerYelverton, Tytan Sandate, MD 10/12/18 1606

## 2019-02-04 ENCOUNTER — Emergency Department (HOSPITAL_COMMUNITY)
Admission: EM | Admit: 2019-02-04 | Discharge: 2019-02-04 | Disposition: A | Discharge status: Home / Self Care | Payer: Medicaid Other | Attending: Emergency Medicine | Admitting: Emergency Medicine

## 2019-02-04 ENCOUNTER — Encounter (HOSPITAL_COMMUNITY): Payer: Self-pay | Admitting: Emergency Medicine

## 2019-02-04 ENCOUNTER — Other Ambulatory Visit: Payer: Self-pay

## 2019-02-04 DIAGNOSIS — I10 Essential (primary) hypertension: Secondary | ICD-10-CM | POA: Insufficient documentation

## 2019-02-04 DIAGNOSIS — Z9181 History of falling: Secondary | ICD-10-CM | POA: Diagnosis not present

## 2019-02-04 DIAGNOSIS — Z79899 Other long term (current) drug therapy: Secondary | ICD-10-CM | POA: Insufficient documentation

## 2019-02-04 DIAGNOSIS — R400 Somnolence: Secondary | ICD-10-CM | POA: Diagnosis not present

## 2019-02-04 DIAGNOSIS — W19XXXA Unspecified fall, initial encounter: Secondary | ICD-10-CM

## 2019-02-04 DIAGNOSIS — M549 Dorsalgia, unspecified: Secondary | ICD-10-CM | POA: Diagnosis present

## 2019-02-04 LAB — COMPREHENSIVE METABOLIC PANEL
ALT: 20 U/L (ref 0–44)
AST: 38 U/L (ref 15–41)
Albumin: 3.3 g/dL — ABNORMAL LOW (ref 3.5–5.0)
Alkaline Phosphatase: 38 U/L (ref 38–126)
Anion gap: 6 (ref 5–15)
BUN: 15 mg/dL (ref 6–20)
CHLORIDE: 105 mmol/L (ref 98–111)
CO2: 23 mmol/L (ref 22–32)
CREATININE: 0.66 mg/dL (ref 0.61–1.24)
Calcium: 8.2 mg/dL — ABNORMAL LOW (ref 8.9–10.3)
GFR calc Af Amer: >60 mL/min (ref >60)
Glucose, Bld: 83 mg/dL (ref 70–99)
Potassium: 3.9 mmol/L (ref 3.5–5.1)
Sodium: 134 mmol/L — ABNORMAL LOW (ref 135–145)
Total Bilirubin: 2.2 mg/dL — ABNORMAL HIGH (ref 0.3–1.2)
Total Protein: 6.7 g/dL (ref 6.5–8.1)

## 2019-02-04 LAB — CBC WITH DIFFERENTIAL/PLATELET
Abs Immature Granulocytes: 0.02 10*3/uL (ref 0.00–0.07)
BASOS ABS: 0 10*3/uL (ref 0.0–0.1)
Basophils Relative: 1 %
Eosinophils Absolute: 0.2 10*3/uL (ref 0.0–0.5)
Eosinophils Relative: 6 %
HEMATOCRIT: 32 % — AB (ref 39.0–52.0)
Hemoglobin: 10.5 g/dL — ABNORMAL LOW (ref 13.0–17.0)
IMMATURE GRANULOCYTES: 1 %
LYMPHS ABS: 0.6 10*3/uL — AB (ref 0.7–4.0)
LYMPHS PCT: 17 %
MCH: 30 pg (ref 26.0–34.0)
MCHC: 32.8 g/dL (ref 30.0–36.0)
MCV: 91.4 fL (ref 80.0–100.0)
MONOS PCT: 14 %
Monocytes Absolute: 0.5 10*3/uL (ref 0.1–1.0)
NEUTROS PCT: 61 %
NRBC: 0 % (ref 0.0–0.2)
Neutro Abs: 2.2 10*3/uL (ref 1.7–7.7)
PLATELETS: 98 10*3/uL — AB (ref 150–400)
RBC: 3.5 MIL/uL — ABNORMAL LOW (ref 4.22–5.81)
RDW: 15.1 % (ref 11.5–15.5)
WBC: 3.5 10*3/uL — ABNORMAL LOW (ref 4.0–10.5)

## 2019-02-04 LAB — LIPASE, BLOOD: Lipase: 30 U/L (ref 11–51)

## 2019-02-04 LAB — AMMONIA: AMMONIA: 93 umol/L — AB (ref 9–35)

## 2019-02-04 LAB — ETHANOL

## 2019-02-04 MED ORDER — KETOROLAC TROMETHAMINE 30 MG/ML IJ SOLN
15.0000 mg | Freq: Once | INTRAMUSCULAR | Status: AC
  Administered 2019-02-04: 15 mg via INTRAVENOUS
  Filled 2019-02-04: qty 1

## 2019-02-04 MED ORDER — LACTULOSE 10 GM/15ML PO SOLN
ORAL | Status: AC
  Filled 2019-02-04: qty 30

## 2019-02-04 MED ORDER — LACTULOSE 10 GM/15ML PO SOLN
30.0000 g | Freq: Once | ORAL | Status: AC
  Administered 2019-02-04: 30 g via ORAL
  Filled 2019-02-04: qty 60

## 2019-02-04 NOTE — Discharge Instructions (Signed)
As discussed, is very important that you take all of your medication as directed, including her lactulose and your Lasix.  Please be sure to schedule follow-up with our local physicians if you are now living in this area.  Return here for concerning changes in your condition.

## 2019-02-04 NOTE — ED Triage Notes (Signed)
Patient states he fell two days ago down 16 steps and was treated in [facility] [place]. States he broke bilateral wrists and has splints to both wrists and forearms. States he has to see PCP to get referral to orthopedic doctor. Complaining of pain to bilateral arms, legs, and neck.

## 2019-02-04 NOTE — ED Notes (Signed)
Pt given sprite and graham crackers.  

## 2019-02-04 NOTE — ED Provider Notes (Signed)
[facility] EMERGENCY DEPARTMENT Provider Note   CSN: [id number] Arrival date & time: 02/04/19  1737    History   Chief Complaint Chief Complaint  Patient presents with  . Fall    HPI Andre Sanchez is a 58 y.o. male.     HPI Patient presents after a fall. Patient has multiple medical issues, most notably hepatitis C, cirrhosis. Patient notes that he had a fall 2 days ago, was seen at a facility in the [place] part of the state. He was diagnosed with bilateral wrist fractures, had splints applied. He notes that since that time he has removed his splints, to bathe. He has replaced them both. He has had several falls since that time, none of which seem remarkable compared to the others according to him. He notes that he typically feels generally weak, falls. Patient is sleeping, but awakens easily to answer questions briefly about his presentation. Seems as though his greatest concerns are his recurrent falls, ongoing back pain, which is diffuse. He acknowledges not taking his lactulose regularly.   Past Medical History:  Diagnosis Date  . Hepatitis C, chronic (HCC)   . Hypertension   . Liver cirrhosis (HCC)     There are no active problems to display for this patient.   Past Surgical History:  Procedure Laterality Date  . ESOPHAGUS SURGERY          Home Medications    Prior to Admission medications   Medication Sig Start Date End Date Taking? Authorizing Provider  CONSTULOSE 10 GM/15ML solution TAKE 67.5ML THREE TIMES DAILY 01/03/17  Yes [provider]  furosemide (LASIX) 40 MG tablet Take 40 mg by mouth 2 (two) times daily. 01/03/17  Yes [provider]  spironolactone (ALDACTONE) 50 MG tablet Take 50 mg by mouth daily.    Yes [provider]  Buprenorphine HCl-Naloxone HCl 8-2 MG FILM Place 0.5 Film under the tongue 2 (two) times daily.    [provider]    Family History Family History  Problem Relation Age of Onset  .  Hypertension Mother     Social History Social History   Tobacco Use  . Smoking status: Never Smoker  . Smokeless tobacco: Never Used  Substance Use Topics  . Alcohol use: Yes    Comment: occasionally  . Drug use: No     Allergies   Patient has no known allergies.   Review of Systems Review of Systems  Constitutional:       Per HPI, otherwise negative  HENT:       Per HPI, otherwise negative  Respiratory:       Per HPI, otherwise negative  Cardiovascular:       Per HPI, otherwise negative  Gastrointestinal: Negative for vomiting.  Endocrine:       Negative aside from HPI  Genitourinary:       Neg aside from HPI   Musculoskeletal:       Per HPI, otherwise negative  Skin: Positive for color change.  Allergic/Immunologic: Positive for immunocompromised state.  Neurological: Positive for weakness and light-headedness. Negative for syncope.     Physical Exam Updated Vital Signs BP 109/60   Pulse 84   Temp 98.6 F (37 C) (Oral)   Resp 16   Ht 6\' 7"  (2.007 m)   Wt (!) 181.4 kg   SpO2 93%   BMI 45.06 kg/m   Physical Exam Vitals signs and nursing note reviewed.  Constitutional:      General: He  is not in acute distress.    Appearance: He is well-developed.     Comments: Very large male sleeping, but awakens easily to answer questions briefly.  HENT:     Head: Normocephalic and atraumatic.  Eyes:     Conjunctiva/sclera: Conjunctivae normal.  Cardiovascular:     Rate and Rhythm: Normal rate and regular rhythm.  Pulmonary:     Effort: Pulmonary effort is normal. No respiratory distress.     Breath sounds: No stridor.  Abdominal:     General: There is no distension.  Musculoskeletal:     Comments: Bilateral immobilization splints on each forearm.  Patient moves all digits appropriately, has no gross swelling distal to the splint bilaterally.  Skin:    General: Skin is warm and dry.  Neurological:     Mental Status: He is alert and oriented to person,  place, and time.     Comments: Patient moves all extremities spontaneously, follows commands appropriately, has no facial asymmetry.  Though he is sleeping, he awakens easily.      ED Treatments / Results  Labs (all labs ordered are listed, but only abnormal results are displayed) Labs Reviewed  COMPREHENSIVE METABOLIC PANEL - Abnormal; Notable for the following components:      Result Value   Sodium 134 (*)    Calcium 8.2 (*)    Albumin 3.3 (*)    Total Bilirubin 2.2 (*)    All other components within normal limits  CBC WITH DIFFERENTIAL/PLATELET - Abnormal; Notable for the following components:   WBC 3.5 (*)    RBC 3.50 (*)    Hemoglobin 10.5 (*)    HCT 32.0 (*)    Platelets 98 (*)    Lymphs Abs 0.6 (*)    All other components within normal limits  AMMONIA - Abnormal; Notable for the following components:   Ammonia 93 (*)    All other components within normal limits  ETHANOL  LIPASE, BLOOD    EKG EKG Interpretation  Date/Time:  Wednesday February 04 2019 18:44:00 EDT Ventricular Rate:  84 PR Interval:    QRS Duration: 94 QT Interval:  384 QTC Calculation: 454 R Axis:   47 Text Interpretation:  Sinus rhythm Probable left atrial enlargement Baseline wander in lead(s) V1 unremarkable ECG Confirmed by Gerhard Munch [phone]) on 02/04/2019 7:25:15 PM   Radiology No results found.  Procedures Procedures (including critical care time)  Medications Ordered in ED Medications  lactulose (CHRONULAC) 10 GM/15ML solution 30 g (has no administration in time range)  ketorolac (TORADOL) 30 MG/ML injection 15 mg (has no administration in time range)     Initial Impression / Assessment and Plan / ED Course  I have reviewed the triage vital signs and the nursing notes.  Pertinent labs & imaging results that were available during my care of the patient were reviewed by me and considered in my medical decision making (see chart for details).        8:35 PM Patient much  more awake and alert. Patient complains of back pain, but moves his back freely, sitting upright in bed. I reviewed the labs with him, generally reassuring, consistent for him aside from slight elevation from baseline lactulose level. Patient is awake and alert, capable of tolerating oral intake, will receive lactulose here. Given the patient's bilateral forearm fractures, with poor dressings, both of these will be replaced. Patient received Toradol for back pain control. Discussed the importance of following up with a local gastroenterologist if he is  indeed relocating to this area. Without evidence for other acute new pathology, without other new focal complaints, neurologic deficiency, the patient is appropriate for discharge with close outpatient follow-up. He was encouraged to take his lactulose on discharge.  Final Clinical Impressions(s) / ED Diagnoses  Fall, initial encounter Somnolence   Gerhard MunchLockwood, Jr Milliron, MD 02/04/19 2110

## 2019-02-05 ENCOUNTER — Telehealth: Payer: Self-pay | Admitting: Orthopedic Surgery

## 2019-02-05 NOTE — Telephone Encounter (Signed)
Patient came by office (had previously checked with our office 02/03/19 about how to schedule appointment if he was first seen at hospital in [place], [place]; said "have 2 broke arms";  he was given protocol to obtain records and films for review prior to scheduling) Today, 02/05/19, patient came without any information as previously discussed; states he was seen at [facility] emergency room last night, and that they re-wrapped his arms. Per the visit summary he had with him, he was referred to Dr Duwayne Heck, [facility].  Re-discussed protocol and advised on what medical/emergency room physician notes from [facility] would be needed, as well as copies of Xray films and reports.  York Spaniel he will go back there tomorrow and request. Appointment pending doctors' review and advice. Patient aware.

## 2020-03-26 DEATH — deceased
